# Patient Record
Sex: Male | Born: 1985 | Race: White | Hispanic: No | Marital: Married | Smoking: Never smoker
Health system: Southern US, Community
[De-identification: ages and names within clinical notes are randomized; demographics above are authoritative.]

---

## 2008-06-01 ENCOUNTER — Emergency Department (HOSPITAL_COMMUNITY): Admission: EM | Admit: 2008-06-01 | Discharge: 2008-06-01 | Payer: Self-pay | Admitting: Emergency Medicine

## 2012-12-23 ENCOUNTER — Telehealth: Payer: Self-pay | Admitting: *Deleted

## 2012-12-23 ENCOUNTER — Ambulatory Visit: Payer: Self-pay

## 2012-12-23 ENCOUNTER — Ambulatory Visit (HOSPITAL_COMMUNITY)
Admission: RE | Admit: 2012-12-23 | Discharge: 2012-12-23 | Disposition: A | Discharge status: Home / Self Care | Payer: BC Managed Care – PPO | Source: Ambulatory Visit | Attending: Family Medicine | Admitting: Family Medicine

## 2012-12-23 ENCOUNTER — Ambulatory Visit (INDEPENDENT_AMBULATORY_CARE_PROVIDER_SITE_OTHER): Payer: BC Managed Care – PPO | Admitting: Family Medicine

## 2012-12-23 VITALS — BP 111/70 | HR 82 | Temp 98.1°F | Resp 16 | Ht 70.0 in | Wt 198.6 lb

## 2012-12-23 DIAGNOSIS — R1031 Right lower quadrant pain: Secondary | ICD-10-CM | POA: Insufficient documentation

## 2012-12-23 DIAGNOSIS — R1110 Vomiting, unspecified: Secondary | ICD-10-CM

## 2012-12-23 DIAGNOSIS — M549 Dorsalgia, unspecified: Secondary | ICD-10-CM | POA: Insufficient documentation

## 2012-12-23 DIAGNOSIS — D72829 Elevated white blood cell count, unspecified: Secondary | ICD-10-CM

## 2012-12-23 DIAGNOSIS — R109 Unspecified abdominal pain: Secondary | ICD-10-CM

## 2012-12-23 DIAGNOSIS — K7689 Other specified diseases of liver: Secondary | ICD-10-CM | POA: Insufficient documentation

## 2012-12-23 DIAGNOSIS — R52 Pain, unspecified: Secondary | ICD-10-CM | POA: Insufficient documentation

## 2012-12-23 LAB — POCT CBC
Lymph, poc: 0.6 (ref 0.6–3.4)
MCHC: 32.2 g/dL (ref 31.8–35.4)
MPV: 8.9 fL (ref 0–99.8)
POC Granulocyte: 12.4 — AB (ref 2–6.9)
POC LYMPH PERCENT: 4.4 %L — AB (ref 10–50)
POC MID %: 3.2 %M (ref 0–12)
Platelet Count, POC: 210 10*3/uL (ref 142–424)
RDW, POC: 13.8 %

## 2012-12-23 LAB — POCT URINALYSIS DIPSTICK
Leukocytes, UA: NEGATIVE
Nitrite, UA: NEGATIVE
Protein, UA: NEGATIVE
pH, UA: 6.5

## 2012-12-23 LAB — COMPREHENSIVE METABOLIC PANEL
ALT: 27 U/L (ref 0–53)
AST: 21 U/L (ref 0–37)
Calcium: 10.1 mg/dL (ref 8.4–10.5)
Chloride: 103 mEq/L (ref 96–112)
Creat: 0.93 mg/dL (ref 0.50–1.35)
Potassium: 4.6 mEq/L (ref 3.5–5.3)

## 2012-12-23 LAB — POCT UA - MICROSCOPIC ONLY
Casts, Ur, LPF, POC: NEGATIVE
Yeast, UA: NEGATIVE

## 2012-12-23 MED ORDER — KETOROLAC TROMETHAMINE 60 MG/2ML IM SOLN
60.0000 mg | Freq: Once | INTRAMUSCULAR | Status: AC
  Administered 2012-12-23: 60 mg via INTRAMUSCULAR

## 2012-12-23 MED ORDER — IOHEXOL 300 MG/ML  SOLN
100.0000 mL | Freq: Once | INTRAMUSCULAR | Status: AC | PRN
  Administered 2012-12-23: 100 mL via INTRAVENOUS

## 2012-12-23 MED ORDER — ONDANSETRON 4 MG PO TBDP: ORAL_TABLET | ORAL | Status: DC

## 2012-12-23 MED ORDER — ONDANSETRON 4 MG PO TBDP: 4.0000 mg | ORAL_TABLET | Freq: Once | ORAL | Status: DC

## 2012-12-23 MED ORDER — ONDANSETRON 4 MG PO TBDP
4.0000 mg | ORAL_TABLET | Freq: Once | ORAL | Status: AC
  Administered 2012-12-23: 4 mg via ORAL

## 2012-12-23 NOTE — Progress Notes (Signed)
Subjective: Patient bent over to pick up something he dropped on the floor at his desk at work yesterday. When he sat back up he had intense right flank pain. It persisted last night. It has gradually become more diffuse across his whole back though it still hurts more in the right flank area. This morning he ate this small breakfast and went to work. He began vomiting, and is persisted vomited about 10 times. He is just on a water now. He is still nauseous. He has not had fever. He has not had problems like this in the past. He generally a very healthy individual. He had a small plate of spaghetti for supper last night. Pain bothered him through the night. He is miserable. He rates the pain at 7/10. His wife noticed a pale he was.  Objective: Man in some obvious discomfort. Lips are very pale although conjunctiva and nail beds look pink. He is chest is clear. Heart regular without murmurs. Abdomen had active bowel sounds and there were audible borborygmi. Abdomen is soft, mildly tender in the right upper quadrant. No real CVA tenderness but he is tender to the right of the spine in the right mid back on palpation. Percussion seemed to not be as bad. He is not tender down the spine. Left back seems fine.  Assessment: Acute back pain on right Persistent vomiting Nausea  Plan: X-rays and labs. Zofran for the nausea.  Results for orders placed in visit on 12/23/12  POCT CBC      Component Value Range   WBC 13.4 (*) 4.6 - 10.2 K/uL   Lymph, poc 0.6  0.6 - 3.4   POC LYMPH PERCENT 4.4 (*) 10 - 50 %L   MID (cbc) 0.4  0 - 0.9   POC MID % 3.2  0 - 12 %M   POC Granulocyte 12.4 (*) 2 - 6.9   Granulocyte percent 92.4 (*) 37 - 80 %G   RBC 5.34  4.69 - 6.13 M/uL   Hemoglobin 16.2  14.1 - 18.1 g/dL   HCT, POC 40.9  81.1 - 53.7 %   MCV 94.2  80 - 97 fL   MCH, POC 30.3  27 - 31.2 pg   MCHC 32.2  31.8 - 35.4 g/dL   RDW, POC 91.4     Platelet Count, POC 210  142 - 424 K/uL   MPV 8.9  0 - 99.8 fL  POCT  URINALYSIS DIPSTICK      Component Value Range   Color, UA yellow     Clarity, UA clear     Glucose, UA neg     Bilirubin, UA neg     Ketones, UA 40     Spec Grav, UA 1.020     Blood, UA neg     pH, UA 6.5     Protein, UA neg     Urobilinogen, UA 0.2     Nitrite, UA neg     Leukocytes, UA Negative    POCT UA - MICROSCOPIC ONLY      Component Value Range   WBC, Ur, HPF, POC 0-5     RBC, urine, microscopic 0-1     Bacteria, U Microscopic neg     Mucus, UA moderate     Epithelial cells, urine per micros neg     Crystals, Ur, HPF, POC neg     Casts, Ur, LPF, POC neg     Yeast, UA neg     [facility] reading (PRIMARY)  by  Dr. Alwyn Ren No acute findings.  Nonspecific gas and stool, suggest constipation.Kerby Less.  Some rlq pain.  Needs more w/u   Will go ahead and get a CT scan of the abdomen. It is normal he can go home. If it is abnormal we will need to treat accordingly, probably sending him to the emergency room.  If he gets in all worse he needs come back to the emergency room. If not better by tomorrow he should come in here.  With the pain in the right flank, mild right lower quadrant tenderness, and vomiting and elevated white blood count believe we need to rule out appendicitis.

## 2012-12-23 NOTE — Telephone Encounter (Signed)
Advised pt of results of CT scan per Eula Listen.  Results were normal and he a benign cyst on liver.  To do Miralax as directed by Dr. Alwyn Ren and to drink lots and lots of water.  Advised to recheck tom unless he is 100 percent better.  If he is 100 percent better then he needs to call and update [place].

## 2012-12-23 NOTE — Patient Instructions (Signed)
CT scan of abdomen. If it is normal he can go home and take liquids and see how you do. If you get a proper worse go to the emergency room. If you're not doing better by tomorrow return for recheck.  Taking medicine for nausea when needed.  If the CT is normal, try to drink one dose of MiraLax which you can buy over-the-counter.

## 2012-12-24 NOTE — Telephone Encounter (Signed)
OK.  That was what Chase Gomez and I had discussed.

## 2012-12-29 ENCOUNTER — Telehealth: Payer: Self-pay | Admitting: Family Medicine

## 2012-12-29 ENCOUNTER — Encounter: Payer: Self-pay | Admitting: Family Medicine

## 2012-12-29 NOTE — Telephone Encounter (Signed)
Opened in error

## 2014-03-01 IMAGING — CT CT ABD-PELV W/ CM
2 of 4 series · 17 of 46 positions shown, 19 images · IV contrast (OMNIPAQUE)
Comparison: None.

CLINICAL DATA: Right lower quadrant pain.  Acute back pain.
Elevated white blood count.  Vomiting.

CT ABDOMEN AND PELVIS WITH CONTRAST
TECHNIQUE: Multidetector CT imaging of the abdomen and pelvis was
performed following the standard protocol during bolus
administration of intravenous contrast.
Contrast: 100mL OMNIPAQUE IOHEXOL 300 MG/ML  SOLN

[Series 2: rtn a/p with · axial · 0.84mm/px · z∈[-444,-9]mm · 14 of 97 slices shown, 16 images]
[im 5/97  soft-tissue]
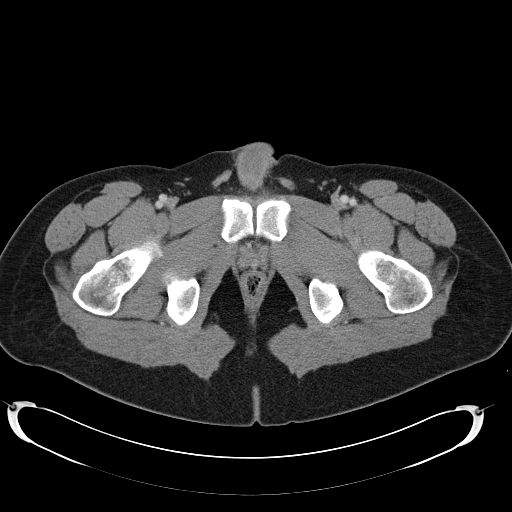
[im 5/97  bone]
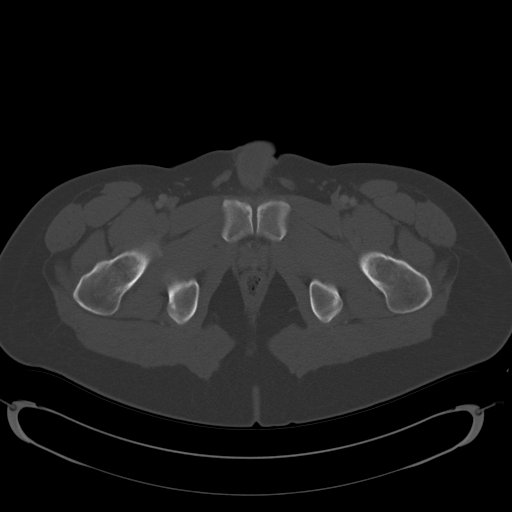
[im 13/97  soft-tissue]
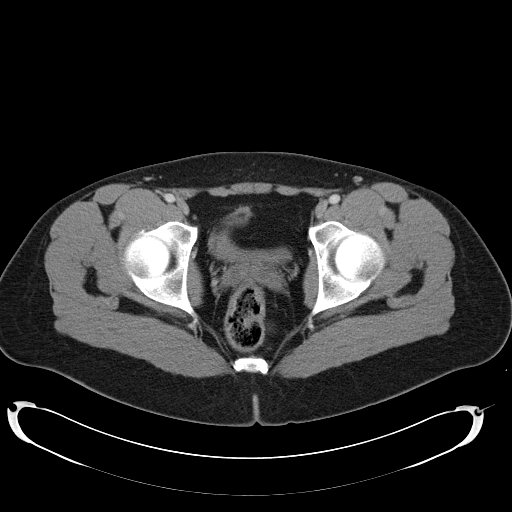
[im 17/97  soft-tissue]
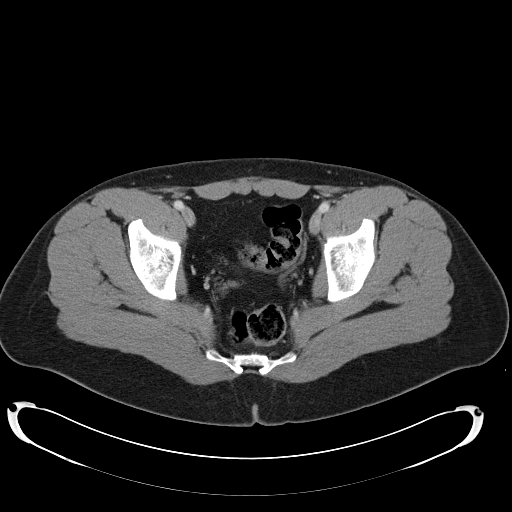
[im 26/97  soft-tissue]
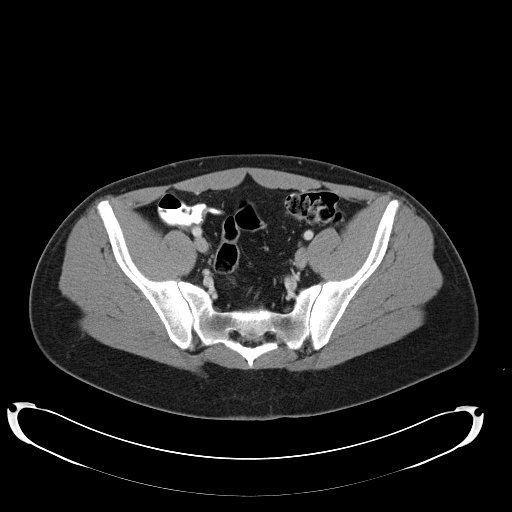
[im 34/97  soft-tissue]
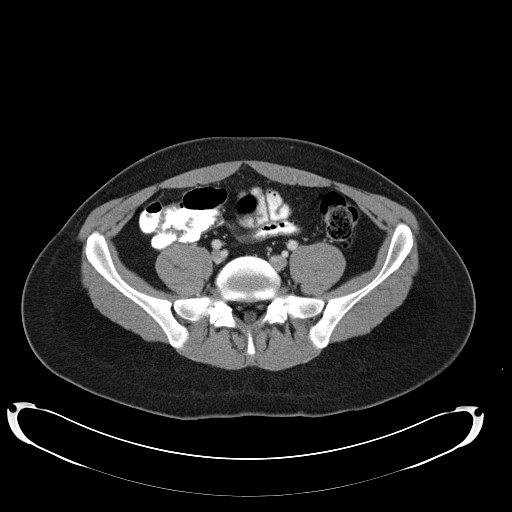
[im 38/97  soft-tissue]
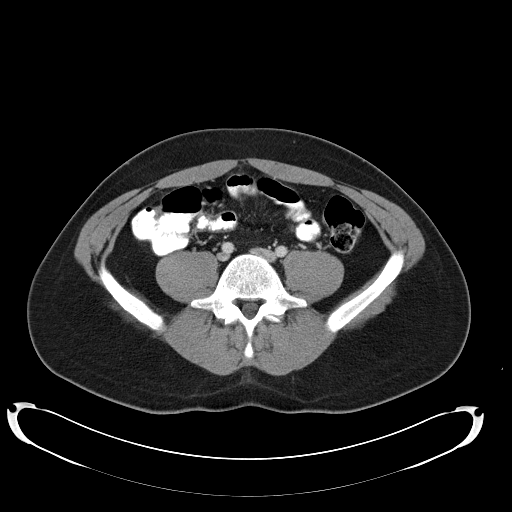
[im 46/97  soft-tissue]
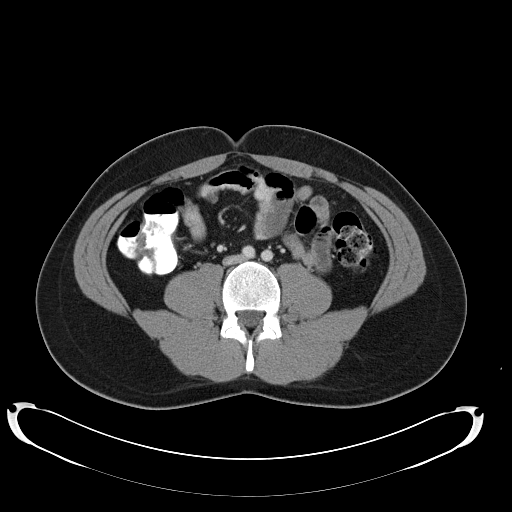
[im 51/97  soft-tissue]
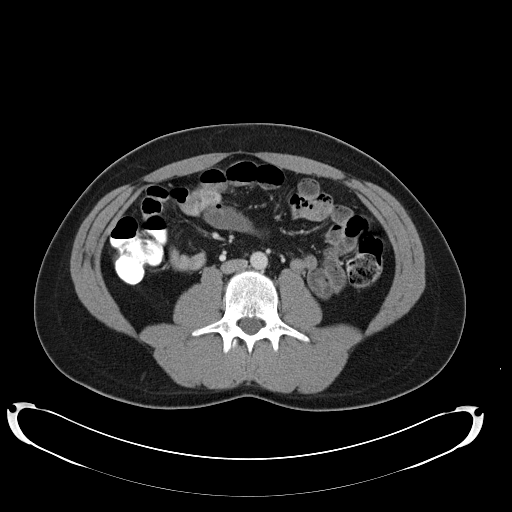
[im 59/97  soft-tissue]
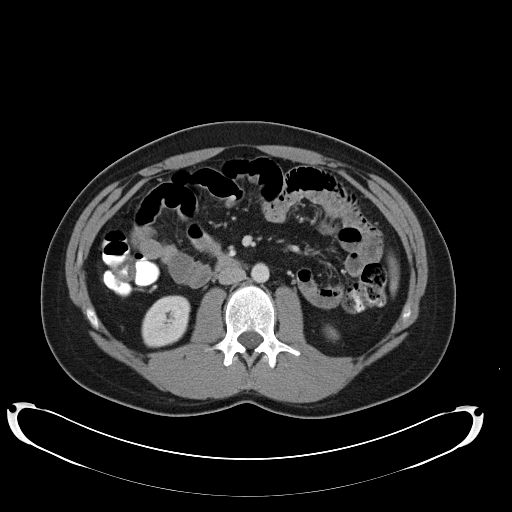
[im 59/97  bone]
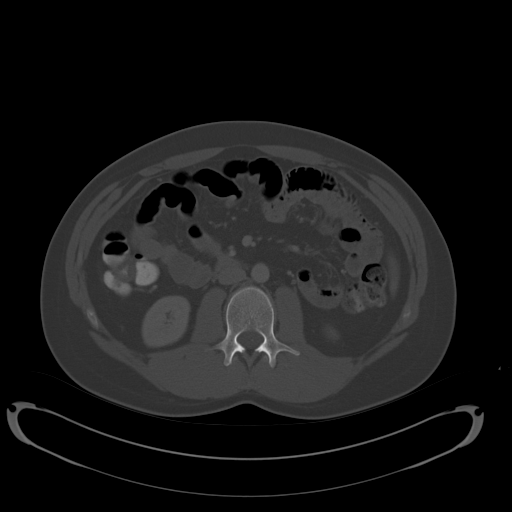
[im 63/97  soft-tissue]
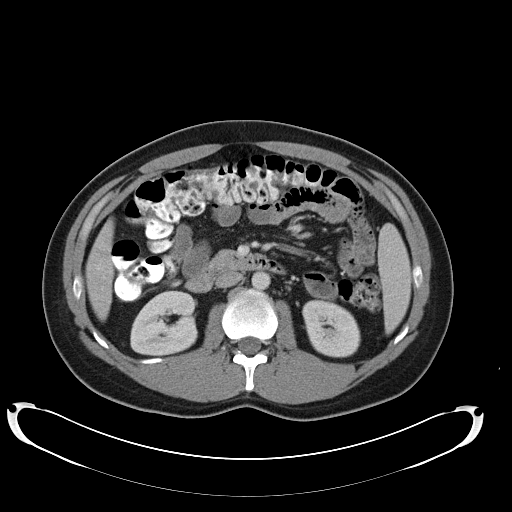
[im 71/97  soft-tissue]
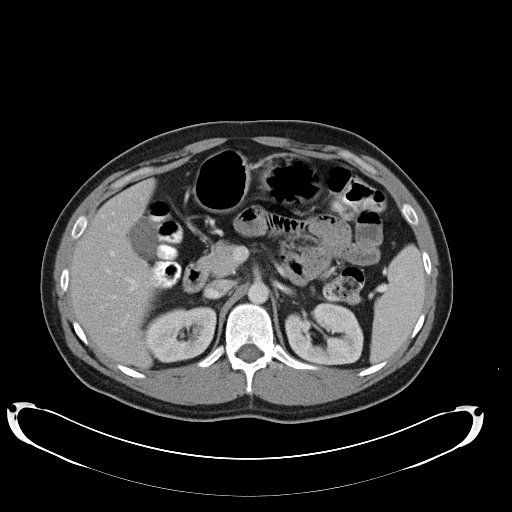
[im 80/97  soft-tissue]
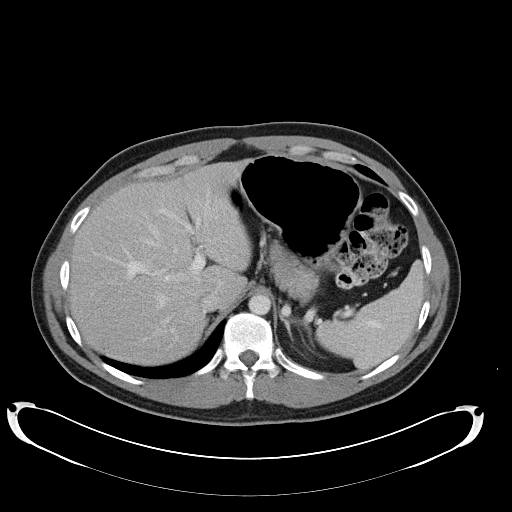
[im 84/97  soft-tissue]
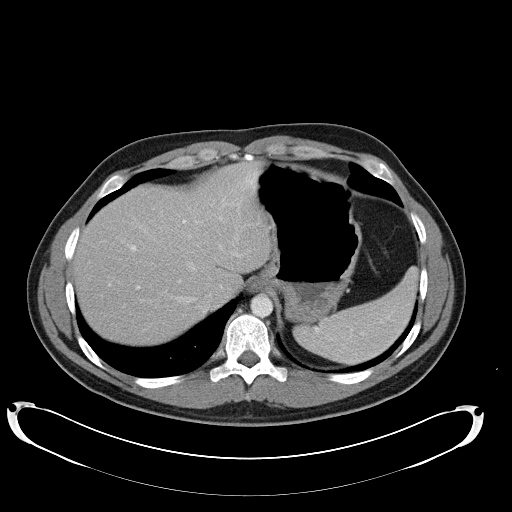
[im 92/97  soft-tissue]
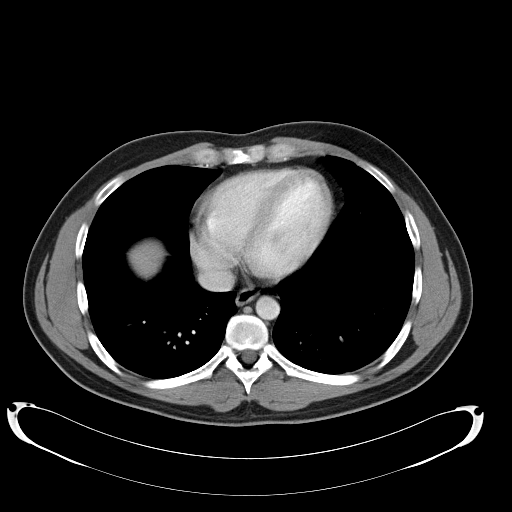

[Series 602: cor · coronal · 0.94mm/px · 3 of 115 slices shown]
[im 39/115  soft-tissue]
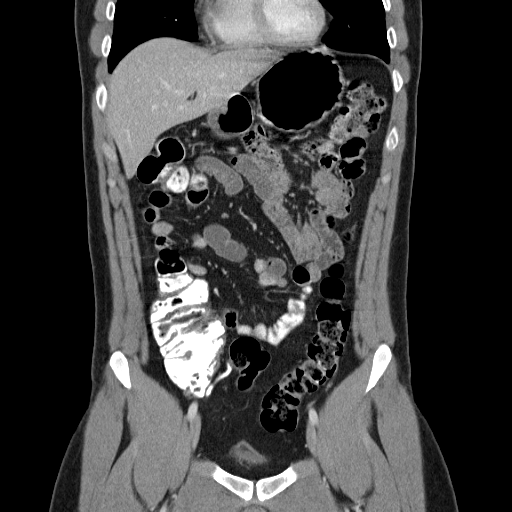
[im 51/115  soft-tissue]
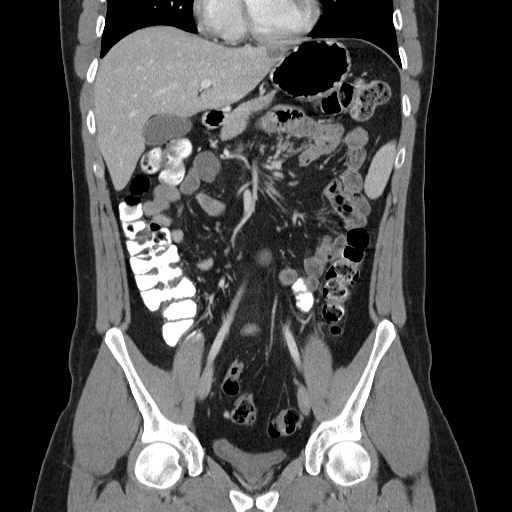
[im 64/115  soft-tissue]
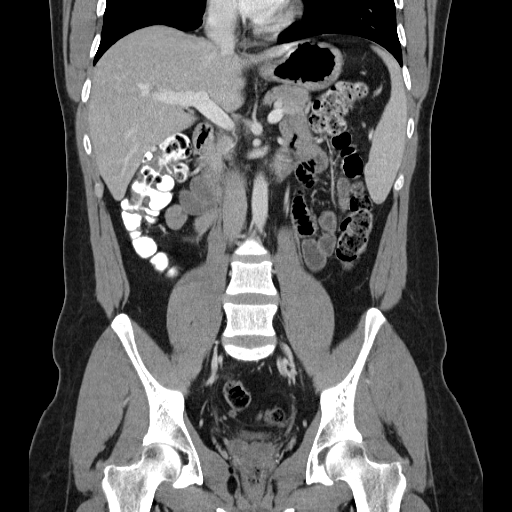

[17 of 46 positions shown; findings below may reference images not displayed]

FINDINGS: There is a 16 x 10 mm lucent lesion in the dome of the
left lobe of the liver, probably a cyst or benign hemangioma.  This
is not felt to be significant.  The rest of the liver parenchyma is
normal.  Biliary tree is normal.

Spleen, pancreas, adrenal glands, and kidneys are normal.  Bowel is
normal including the terminal ileum and appendix.  No diverticular
disease.  No free air free fluid.  No renal or ureteral calculi.
Osseous structures are normal.
IMPRESSION: Small hemangioma or cyst in the left lobe of the liver.  Otherwise,
benign-appearing abdomen and pelvis.

## 2014-04-04 ENCOUNTER — Emergency Department (HOSPITAL_COMMUNITY)
Admission: EM | Admit: 2014-04-04 | Discharge: 2014-04-04 | Disposition: A | Discharge status: Home / Self Care | Payer: 59 | Attending: Emergency Medicine | Admitting: Emergency Medicine

## 2014-04-04 ENCOUNTER — Encounter (HOSPITAL_COMMUNITY): Payer: Self-pay | Admitting: Emergency Medicine

## 2014-04-04 DIAGNOSIS — L237 Allergic contact dermatitis due to plants, except food: Secondary | ICD-10-CM

## 2014-04-04 DIAGNOSIS — M25476 Effusion, unspecified foot: Secondary | ICD-10-CM | POA: Insufficient documentation

## 2014-04-04 DIAGNOSIS — Z888 Allergy status to other drugs, medicaments and biological substances status: Secondary | ICD-10-CM | POA: Insufficient documentation

## 2014-04-04 DIAGNOSIS — L255 Unspecified contact dermatitis due to plants, except food: Secondary | ICD-10-CM | POA: Insufficient documentation

## 2014-04-04 DIAGNOSIS — M25473 Effusion, unspecified ankle: Secondary | ICD-10-CM | POA: Insufficient documentation

## 2014-04-04 MED ORDER — PREDNISONE 20 MG PO TABS: ORAL_TABLET | ORAL | Status: AC

## 2014-04-04 NOTE — ED Provider Notes (Signed)
CSN: [id number]     Arrival date & time 04/04/14  1414 History  This chart was scribed for non-physician practitioner Raymon MuttonMarissa Snyder Colavito, PA-C working with Gavin PoundMichael Y. Oletta LamasGhim, MD by Dorothey Basemania Sutton, ED Scribe. This patient was seen in room TR04C/TR04C and the patient's care was started at 9:34 PM.    Chief Complaint  Patient presents with  . Rash   The history is provided by the patient. No language interpreter was used.   HPI Comments: Braxton FeathersDrew A Reiger is a 28 y.o. male who presents to the Emergency Department complaining of a blistering, erythematous, weeping rash to the bilateral lower legs onset about 4 days ago. Patient reports that he was evaluated for the rash at a walk-in clinic 3 days ago and was told that it is due to poison ivy and patient was started on Keflex and Elocon cream. Patient reports that the area was marked at that time and he was advised to follow up if the redness started to spread. He states that the redness has been gradually spreading and the rash has been progressively worsening despite treatment. Patient reports associated swelling to the bilateral ankles, worse on the right, onset yesterday. He denies itching or pain to the area. He denies fever, chills, chest pain, shortness of breath, numbness. Patient has an allergy to codeine. Patient has no other pertinent medical history.   History reviewed. No pertinent past medical history. History reviewed. No pertinent past surgical history. History reviewed. No pertinent family history. History  Substance Use Topics  . Smoking status: Never Smoker   . Smokeless tobacco: Not on file  . Alcohol Use: No    Review of Systems  Constitutional: Negative for fever and chills.  Respiratory: Negative for shortness of breath.   Cardiovascular: Negative for chest pain.  Musculoskeletal: Positive for joint swelling.  Skin: Positive for rash.  Neurological: Negative for numbness.  All other systems reviewed and are negative.     Allergies   Codeine  Home Medications   Prior to Admission medications   Medication Sig Start Date End Date Taking? Authorizing Provider  cephALEXin (KEFLEX) 500 MG capsule Take 500 mg by mouth 3 (three) times daily. For 7 days. Started on 04-01-14   Yes Historical Provider, MD  mometasone (ELOCON) 0.1 % cream Apply 1 application topically daily.   Yes Historical Provider, MD   BP 120/71  Pulse 76  Temp(Src) 98.7 F (37.1 C) (Oral)  Resp 18  SpO2 100%  Physical Exam  Nursing note and vitals reviewed. Constitutional: He is oriented to person, place, and time. He appears well-developed and well-nourished. No distress.  HENT:  Head: Normocephalic and atraumatic.  Mouth/Throat: Oropharynx is clear and moist. No oropharyngeal exudate.  Eyes: Conjunctivae and EOM are normal. Pupils are equal, round, and reactive to light. Right eye exhibits no discharge. Left eye exhibits no discharge.  Neck: Normal range of motion. Neck supple. No tracheal deviation present.  Cardiovascular: Normal rate, regular rhythm and normal heart sounds.   Cap refill less than 3 seconds  Pulmonary/Chest: Effort normal and breath sounds normal. No respiratory distress. He has no wheezes. He has no rales.  Patient is able to speak in full senses that difficulty Negative use of accessory muscles Negative stridor  Abdominal: He exhibits no distension.  Musculoskeletal: Normal range of motion.  Full ROM to upper and lower extremities without difficulty noted, negative ataxia noted.  Lymphadenopathy:    He has no cervical adenopathy.  Neurological: He is alert and oriented  to person, place, and time. No cranial nerve deficit. He exhibits normal muscle tone. Coordination normal.  Cranial nerves III-XII grossly intact Strength 5+/5+ to lower extremities bilaterally with resistance applied, equal distribution noted Strength intact to digits of the feet bilaterally Sensation intact with differentiation sharp and dull touch  Skin:  Skin is warm and dry. Rash noted. There is erythema.     Linear vesicular rash measuring approximately 3 cm localized to the ulnar aspect of the right forearm, distal to the wrist.  Vesicular rash localized to the medial aspect of the left calf. Negative drainage. Negative erythema, inflammation, swelling, cellulitic findings.  Swelling and erythema identified to the right, lower leg, just above the ankle. Bullous. Negative warmth to palpation.  Tight sensation and appearance to the skin, shiny.Negative pain to palpation.  DP and PT pulses 2+ bilaterally. Capillary refill < 3 seconds.  Psychiatric: He has a normal mood and affect. His behavior is normal.    ED Course  Procedures (including critical care time)  DIAGNOSTIC STUDIES: Oxygen Saturation is 100% on room air, normal by my interpretation.    COORDINATION OF CARE: 9:40 PM- Discussed treatment plan with patient at bedside and patient verbalized agreement.   10:23 PM Patient seen and assessed by attending physician.   Labs Review Labs Reviewed - No data to display  Imaging Review No results found.   EKG Interpretation None      MDM   Final diagnoses:  Poison ivy dermatitis    I personally performed the services described in this documentation, which was scribed in my presence. The recorded information has been reviewed and is accurate.  Filed Vitals:   04/04/14 1719 04/04/14 2241  BP: 119/82 120/71  Pulse: 87 76  Temp:  98.7 F (37.1 C)  TempSrc:  Oral  Resp: 18 18  SpO2: 100% 100%    Suspicion to be poison ivy. Erythematous and bullous rash identified to the medial aspect of the lower right calf region. Mild swelling identified to the right ankle circumferentially-negative pain upon palpation. Pulses palpable and strong. Strength intact. Sensation intact. Negative focal neurological deficits noted. Patient seen and assessed by attending physician, Dr. Marco CollieGhim-recommended patient be placed on prednisone 2 weeks  taper. Negative findings of cellulitic infection as of right now. Patient stable, afebrile. Patient appears not septic. Discharged patient. Discharged patient with prednisone - as per instructions from attending physician. Discussed with patient to rest and stay hydrated. Discussed with patient to keep wound covered. Discussed with patient that poison ivy is contagious and to keep contact with the fluid in the bullous. [facility] Discussed with patient to closely monitor symptoms and if symptoms are to worsen or change to report back to the ED - strict return instructions given.  Patient agreed to plan of care, understood, all questions answered.   Raymon MuttonMarissa Jailah Willis, PA-C 04/05/14 1223

## 2014-04-04 NOTE — Discharge Instructions (Signed)
Please call your doctor for a followup appointment within 24-48 hours. When you talk to your doctor please let them know that you were seen in the emergency department and have them acquire all of your records so that they can discuss the findings with you and formulate a treatment plan to fully care for your new and ongoing problems. Please call and set-up an appointment with [facility] Please call and set-up an appointment with Dermatologist Please keep site covered to prevent spreading - the fluid in the rash is contagious  Please wash everything that touches the wound Please continue to monitor symptoms closely and if symptoms are to worsen or change (fever greater than 101, chills, sweating, chest pain, shortness of breath difficulty breathing, swelling to the left, red streaks running up the leg, numbness, tingling, worsening or changes to the rash, pain) please report back to the ED immediately   Poison [facility] Poison ivy is a inflammation of the skin (contact dermatitis) caused by touching the allergens on the leaves of the ivy plant following previous exposure to the plant. The rash usually appears 48 hours after exposure. The rash is usually bumps (papules) or blisters (vesicles) in a linear pattern. Depending on your own sensitivity, the rash may simply cause redness and itching, or it may also progress to blisters which may break open. These must be well cared for to prevent secondary bacterial (germ) infection, followed by scarring. Keep any open areas dry, clean, dressed, and covered with an antibacterial ointment if needed. The eyes may also get puffy. The puffiness is worst in the morning and gets better as the day progresses. This dermatitis usually heals without scarring, within 2 to 3 weeks without treatment. HOME CARE INSTRUCTIONS  Thoroughly wash with soap and water as soon as you have been exposed to poison ivy. You have about one half hour to remove the plant resin  before it will cause the rash. This washing will destroy the oil or antigen on the skin that is causing, or will cause, the rash. Be sure to wash under your fingernails as any plant resin there will continue to spread the rash. Do not rub skin vigorously when washing affected area. Poison ivy cannot spread if no oil from the plant remains on your body. A rash that has progressed to weeping sores will not spread the rash unless you have not washed thoroughly. It is also important to wash any clothes you have been wearing as these may carry active allergens. The rash will return if you wear the unwashed clothing, even several days later. Avoidance of the plant in the future is the best measure. Poison ivy plant can be recognized by the number of leaves. Generally, poison ivy has three leaves with flowering branches on a single stem. Diphenhydramine may be purchased over the counter and used as needed for itching. Do not drive with this medication if it makes you drowsy.Ask your caregiver about medication for children. SEEK MEDICAL CARE IF:  Open sores develop.  Redness spreads beyond area of rash.  You notice purulent (pus-like) discharge.  You have increased pain.  Other signs of infection develop (such as fever). Document Released: 11/15/2000 Document Revised: 02/10/2012 Document Reviewed: 10/04/2009 [facility] Patient Information 2014 [place], [place].   Emergency Department Resource Guide 1) Find a Doctor and Pay Out of Pocket Although you won't have to find out who is covered by your insurance plan, it is a good idea to ask around and get recommendations. You  will then need to call the office and see if the doctor you have chosen will accept you as a new patient and what types of options they offer for patients who are self-pay. Some doctors offer discounts or will set up payment plans for their patients who do not have insurance, but you will need to ask so you aren't surprised when you get to  your appointment.  2) Contact Your Local Health Department Not all health departments have doctors that can see patients for sick visits, but many do, so it is worth a call to see if yours does. If you don't know where your local health department is, you can check in your phone book. The CDC also has a tool to help you locate your state's health department, and many state websites also have listings of all of their local health departments.  3) Find a Walk-in Clinic If your illness is not likely to be very severe or complicated, you may want to try a walk in clinic. These are popping up all over the country in pharmacies, drugstores, and shopping centers. They're usually staffed by nurse practitioners or physician assistants that have been trained to treat common illnesses and complaints. They're usually fairly quick and inexpensive. However, if you have serious medical issues or chronic medical problems, these are probably not your best option.  No Primary Care Doctor: - Call Health Connect at  [phone] - they can help you locate a primary care doctor that  accepts your insurance, provides certain services, etc. - Physician Referral Service- [phone]  Chronic Pain Problems: Organization         Address  Phone   Notes  [facility]  [phone] Patients need to be referred by their primary care doctor.   Medication Assistance: Organization         Address  Phone   Notes  [facility] Medication [facility] [address] [place], [place] [address] [phone] --Must be a resident of [facility] -- Must have NO insurance coverage whatsoever (no Medicaid/ Medicare, etc.) -- The pt. MUST have a primary care doctor that directs their care regularly and follows them in the community   [facility]ist  [phone]   [employer]  [phone]    Agencies that provide inexpensive medical care: Organization         Address  Phone    Notes  [facility]  [phone]   [facility]    [phone]   [facility] [address] [place], [place] [address] [phone]   [facility] [address] [place], [place] [phone]   Planned Parenthood    [phone]   [facility]    [phone]   [facility]  [address], [place] Phone:  [phone], Fax:  [phone] Hours of Operation:  9 am - 6 pm, M-F.  Also accepts Medicaid/Medicare and self-pay.  [facility]  [address], [place] Phone: [phone], Fax: [phone]. Hours of Operation:  8:30 am - 5:30 pm, M-F.  Also accepts Medicaid and self-pay.  [facility] [place] [address] [place] Phone: [phone]   [facility] [address] Natasha Bence [place], [place] [phone], Ext. [phone] Mondays & Thursdays: 7-9 AM.  First 15 patients are seen on a first come, first  serve basis.    Medicaid-accepting [facility] Providers:  Organization         Address  Phone   Notes  [facility] [address], [place] [phone] Also accepts self-pay patients.  [facility] [address], [place]  [phone]) [phone]   [facility] [address], [place] [phone](336) [phone]   [facility] [address] [place] [address], [place] [phone]) [phone]   Renaye RakersVeita Bland [address], [place]   [phone] Only accepts [place] Access [place] patients after they have their name applied to their card.   Self-Pay (no insurance) in [facility] [place]:  Organization         Address  Phone   Notes  Sickle Cell Patients, [facility] [address], [place] [phone]) [phone]   [facility] [address], [place] [phone]) [phone]   [facility]  [address], [place] [phone]   [facility]/Dr. Osei-Bonsu  [address] [place] [address], [place] or [phone], [place] [phone]) [phone] Phone number for both [place] and [place] locations is the same.  [facility] [address], [place] [phone]) [phone]   [facility] [place] [address] [place] [address], [place] or [address] [phone](336) [phone]   [facility] [address], [place] [phone]) [phone], phone; [phone]) [phone], fax Sees patients 1st and 3rd Saturday of every month.  Must not qualify for public or private insurance (i.e. Medicaid, Medicare, [place] Health Choice, Veterans' Benefits)  Household income should be no more than 200% of the poverty level The clinic cannot treat you if you are pregnant or think you are pregnant  Sexually transmitted diseases are not treated at the clinic.    Dental Care: Organization         Address  Phone  Notes   [facility] [address], [place] [phone]) [phone] Accepts children up to age 30 who are enrolled in [place] or [place] Health Choice; pregnant women with a Medicaid card; and children who have applied for Medicaid or [place] Health Choice, but were declined, whose parents can pay a reduced fee at time of service.  [facility] of [facility] [place]  [address], [place] [phone]) [phone] Accepts children up to age 30 who are enrolled in [place] or [place] Health Choice; pregnant women with a Medicaid card; and children who have applied for Medicaid or [place] Health Choice, but were declined, whose parents can pay a reduced fee at time of service.  [facility]  [address], [place] [phone]) [phone] Patients are seen by appointment only. Walk-ins are not accepted. [facility] will see patients 918 years of age  and older. Monday - Tuesday (8am-5pm) Most Wednesdays (8:30-5pm) $30 per visit, cash only  [facility]  [address], [facility] [phone]) [phone] Patients are seen by appointment only. Walk-ins are not accepted. [facility] will see patients 30 years of age and older. One Wednesday Evening (Monthly: Volunteer Based).  $30 per visit, cash only  [facility]  [phone] for adults; Children under age 644, call Graduate Pediatric Dentistry at [phone]) [phone]. Children aged 24-14, please call (  [phone]) [phone] to request a pediatric application.  Dental services are provided in all areas of dental care including fillings, crowns and bridges, complete and partial dentures, implants, gum treatment, root canals, and extractions. Preventive care is also provided. Treatment is provided to both adults and children. Patients are selected via a lottery and there is often a waiting list.   [facility] [address], [place]  [phone] [email or web address]   [facility] [address] [place], [place] [phone], Ext. [phone] Second and Fourth Thursday of each month, opens at 6:30 AM; Clinic ends at 9 AM.  Patients are seen on a first-come first-served basis, and a limited number are seen during each clinic.   [facility]  [address] [place], [place] [phone]   Eligibility Requirements You must have lived in [place], [place], or [place] counties for at least the last three months.   You cannot be eligible for state or federal sponsored [place], including [employer], [place], or [employer].   You generally cannot be eligible for healthcare insurance through your employer.    How to apply: Eligibility screenings are held every Tuesday and Wednesday afternoon from 1:00 pm until 4:00 pm. You do not need an appointment for the interview!  [facility] [address] [place], [place] [phone]   [facility] Health Department  [phone]   [facility] Department  [phone]   [facility]  [phone]    Behavioral Health Resources in the Community: Intensive Outpatient Programs Organization         Address  Phone  Notes  [facility] [address], [place], [place] [phone]   [facility] [address] [place], [place] [phone]   [facility] [address] [place], [place]  [phone]   [facility] [address],  [place], [place] [phone] or [phone]   Substance Abuse Resources Organization         Address  Phone  Notes  [facility]  [phone]   [facility]  [phone]   [facility]  [phone]   Floydene Flock  [phone]   Residential & Outpatient Substance Abuse Program  [phone]   Psychological Services Organization         Address  Phone  Notes  [facility]  [phone]   [facility]  [phone]   [facility] [address], [place] [phone] or [phone]    Mobile Crisis Teams Organization         Address  Phone  Notes  [facility], Mobile Crisis Care Unit  [phone]   [facility]  [address] [place], [place] [phone]   Doristine Locks [address] [place] [phone]    Self-Help/Support Groups Organization         Address  Phone             Notes  [facility] [place] - variety of support groups  [phone]- [phone] Call for more information  Narcotics Anonymous (NA), [facility] [address], [employer] [place]  2 meetings at this location   Statistician         Address  Phone  Notes  [facility] [address],    [place]  [phone]   [facility]  [address], [place], [place] [phone]   °[facility] [address], [place] [phone] Admissions: 8am-3pm M-F  °[facility] [address]    °[place], [place] [phone]   °[facility] [address], [place], [place] [phone]   °[facility] [address]  °[place], [place] [phone]   °[facility] - Intensive Outpatient [address], [place], [place] [phone]   °[facility] ([facility] [address]  °[place], [place] [phone] or [phone]   °[facility] ([facility]) [address] [place], [place] [phone] Accepts Medicaid  °[facility] [address]  °[place] 1-800-659-3381 Substance Abuse/Addiction Treatment  ° °[place] Behavioral Health Resources °Organization         Address  Phone  Notes  °[facility]  (888) [phone]   °Julie Brannon, PhD [address] [place], [place]   (336) [phone] or (336) 951-0000   °[facility]   [address] °[place], [place] ([phone]) [phone]   °[facility] [address], [place], [place] ([phone]) [phone] Insurance/Medicaid/sponsorship through [facility]  °[facility] [address]                                    [place], [place] ([phone]) [phone] Therapy/tele-psych/case  °[facility] [address]  ° [place], [place] ([phone]) [phone]    °Dr. Arfeen  ([phone]) [phone]   °[facility] [place]  [employer] [place] [facility] 1) [address],  °2) [address], [place] °3)  [address], [place] (336) [phone] °(336) [phone] ° °(336) [phone]   °[place] Child Abuse Hotline (336) 342-1394 or (336) 342-3537 (After Hours)    ° ° ° °

## 2014-04-04 NOTE — ED Provider Notes (Signed)
Medical screening examination/treatment/procedure(s) were conducted as a shared visit with non-physician practitioner(s) and myself.  I personally evaluated the patient during the encounter.   EKG Interpretation None      Pt with rash to RLE, now with spread to left lower leg, slight burning, otherwise not really painful.  Pt had been outside walking in woods with shorts on 5 days ago and rash began thereafter. Blister formation and small lesions spreading from original site.  No fevers.  Pt is on abx already, cream.  Will put on oral steroids as well and refer to dermatology.    Gavin PoundMichael Y. Oletta LamasGhim, MD 04/04/14 2228

## 2014-04-04 NOTE — ED Notes (Signed)
Pt in c/o rash to bilateral lower legs, was told it was poison ivy, started on an antibiotic on Saturday and had skin marked at that time, since then, redness has spread beyond marks and symptoms are worsening

## 2019-09-27 ENCOUNTER — Other Ambulatory Visit: Payer: Self-pay

## 2019-09-27 DIAGNOSIS — Z20822 Contact with and (suspected) exposure to covid-19: Secondary | ICD-10-CM

## 2019-09-28 LAB — NOVEL CORONAVIRUS, NAA: SARS-CoV-2, NAA: NOT DETECTED

## 2019-11-29 ENCOUNTER — Ambulatory Visit: Payer: HRSA Program | Attending: Internal Medicine

## 2019-11-29 DIAGNOSIS — Z20828 Contact with and (suspected) exposure to other viral communicable diseases: Secondary | ICD-10-CM | POA: Diagnosis not present

## 2019-11-29 DIAGNOSIS — Z20822 Contact with and (suspected) exposure to covid-19: Secondary | ICD-10-CM

## 2019-12-01 LAB — NOVEL CORONAVIRUS, NAA: SARS-CoV-2, NAA: NOT DETECTED

## 2022-01-15 ENCOUNTER — Ambulatory Visit: Payer: Self-pay | Admitting: Dermatology

## 2022-03-05 ENCOUNTER — Ambulatory Visit: Payer: Self-pay | Admitting: Dermatology

## 2022-12-06 DIAGNOSIS — R051 Acute cough: Secondary | ICD-10-CM | POA: Diagnosis not present
